# Patient Record
Sex: Female | Born: 1955 | Race: Black or African American | Hispanic: No | Marital: Married | Smoking: Never smoker
Health system: Southern US, Community
[De-identification: ages and names within clinical notes are randomized; demographics above are authoritative.]

## PROBLEM LIST (undated history)

## (undated) HISTORY — PX: TYMPANOPLASTY: SHX33

## (undated) HISTORY — PX: TUBAL LIGATION: SHX77

---

## 1998-04-03 ENCOUNTER — Encounter: Payer: Self-pay | Admitting: Obstetrics and Gynecology

## 1998-04-03 ENCOUNTER — Ambulatory Visit (HOSPITAL_COMMUNITY): Admission: RE | Admit: 1998-04-03 | Discharge: 1998-04-03 | Payer: Self-pay | Admitting: Obstetrics and Gynecology

## 1999-06-23 ENCOUNTER — Encounter: Payer: Self-pay | Admitting: Obstetrics and Gynecology

## 1999-06-23 ENCOUNTER — Ambulatory Visit (HOSPITAL_COMMUNITY): Admission: RE | Admit: 1999-06-23 | Discharge: 1999-06-23 | Payer: Self-pay | Admitting: Obstetrics and Gynecology

## 1999-06-30 ENCOUNTER — Other Ambulatory Visit: Admission: RE | Admit: 1999-06-30 | Discharge: 1999-06-30 | Payer: Self-pay | Admitting: Obstetrics and Gynecology

## 1999-10-07 ENCOUNTER — Ambulatory Visit (HOSPITAL_COMMUNITY): Admission: RE | Admit: 1999-10-07 | Discharge: 1999-10-07 | Payer: Self-pay | Admitting: Obstetrics and Gynecology

## 2001-04-12 ENCOUNTER — Other Ambulatory Visit: Admission: RE | Admit: 2001-04-12 | Discharge: 2001-04-12 | Payer: Self-pay | Admitting: Obstetrics and Gynecology

## 2002-04-26 ENCOUNTER — Other Ambulatory Visit: Admission: RE | Admit: 2002-04-26 | Discharge: 2002-04-26 | Payer: Self-pay | Admitting: Obstetrics and Gynecology

## 2002-08-21 ENCOUNTER — Emergency Department (HOSPITAL_COMMUNITY): Admission: EM | Admit: 2002-08-21 | Discharge: 2002-08-21 | Payer: Self-pay | Admitting: Emergency Medicine

## 2011-06-30 ENCOUNTER — Ambulatory Visit: Payer: 59

## 2011-06-30 ENCOUNTER — Ambulatory Visit (INDEPENDENT_AMBULATORY_CARE_PROVIDER_SITE_OTHER): Payer: 59 | Admitting: Internal Medicine

## 2011-06-30 VITALS — BP 117/71 | HR 60 | Temp 97.8°F | Resp 16 | Ht 66.75 in | Wt 161.6 lb

## 2011-06-30 DIAGNOSIS — Z0000 Encounter for general adult medical examination without abnormal findings: Secondary | ICD-10-CM

## 2011-06-30 DIAGNOSIS — Z228 Carrier of other infectious diseases: Secondary | ICD-10-CM

## 2011-06-30 DIAGNOSIS — Z9289 Personal history of other medical treatment: Secondary | ICD-10-CM | POA: Insufficient documentation

## 2011-06-30 NOTE — Progress Notes (Signed)
  Subjective:    Patient ID: Marissa Hawkins, female    DOB: 1956/01/15, 56 y.o.   MRN: [id number]  HPI  Very healthy, see scanned hx  Review of Systems    see scanned ROS Objective:   Physical Exam Normal  [facility] reading (PRIMARY) by  Dr. Perrin Maltese NAD on CXR       Assessment & Plan:  CXR to screen for TB normal No labs or tests wanted

## 2012-11-29 ENCOUNTER — Ambulatory Visit (INDEPENDENT_AMBULATORY_CARE_PROVIDER_SITE_OTHER): Payer: 59 | Admitting: Family Medicine

## 2012-11-29 VITALS — BP 118/74 | HR 66 | Temp 99.0°F | Resp 18 | Ht 66.0 in | Wt 167.0 lb

## 2012-11-29 DIAGNOSIS — Z0289 Encounter for other administrative examinations: Secondary | ICD-10-CM

## 2012-11-29 NOTE — Progress Notes (Signed)
°  Tuberculosis Risk Questionnaire  1. No Were you born outside the [place] in one of the following parts of the world: [place], [place], [place], [place] or [place]?    2. No Have you traveled outside the [place] and lived for more than one month in one of the following parts of the world: [place], [place], [place], [place] or [place]?    3. No Do you have a compromised immune system such as from any of the following conditions:HIV/AIDS, organ or bone marrow transplantation, diabetes, immunosuppressive medicines (e.g. Prednisone, Remicaide), leukemia, lymphoma, cancer of the head or neck, gastrectomy or jejunal bypass, end-stage renal disease (on dialysis), or silicosis?     4. Yes  Have you ever or do you plan on working in: a residential care center, a health care facility, a jail or prison or homeless shelter?center for handicap    5. No Have you ever: injected illegal drugs, used crack cocaine, lived in a homeless shelter  or been in jail or prison?     6. Yes  Have you ever been exposed to anyone with infectious tuberculosis? Has x ray hx     Tuberculosis Symptom Questionnaire  Do you currently have any of the following symptoms?  1. No Unexplained cough lasting more than 3 weeks?   2. No Unexplained fever lasting more than 3 weeks.   3. No Night Sweats (sweating that leaves the bedclothes and sheets wet)     4. No Shortness of Breath   5. No Chest Pain   6. No Unintentional weight loss    7. No Unexplained fatigue (very tired for no reason)

## 2013-04-02 NOTE — Progress Notes (Signed)
   Subjective:    Patient ID: Marissa Hawkins, female    DOB: June 20, 1955, 58 y.o.   MRN: [id number] Chief Complaint  Patient presents with  . Employment Physical    HPI  No complaints or concerns.  Here today for pre-emp PE w/ TB test.  History reviewed. No pertinent past medical history. History reviewed. No pertinent past surgical history. No current outpatient prescriptions on file prior to visit.   No current facility-administered medications on file prior to visit.   No Known Allergies Family History  Problem Relation Age of Onset  . Stroke Mother    History   Social History  . Marital Status: Married    Spouse Name: N/A    Number of Children: N/A  . Years of Education: N/A   Social History Main Topics  . Smoking status: Never Smoker   . Smokeless tobacco: None  . Alcohol Use: No  . Drug Use: No  . Sexual Activity: Yes   Other Topics Concern  . None   Social History Narrative  . None    Review of Systems  Constitutional: Negative for fever, diaphoresis and fatigue.  Respiratory: Negative for cough and shortness of breath.   Cardiovascular: Negative for chest pain.  Hematological: Negative for adenopathy.  All other systems reviewed and are negative.      BP 118/74  Pulse 66  Temp(Src) 99 F (37.2 C) (Oral)  Resp 18  Ht 5\' 6"  (1.676 m)  Wt 167 lb (75.751 kg)  BMI 26.97 kg/m2  SpO2 100% Objective:   Physical Exam  Constitutional: She is oriented to person, place, and time. She appears well-developed and well-nourished. No distress.  HENT:  Head: Normocephalic and atraumatic.  Right Ear: External ear normal.  Left Ear: External ear normal.  Eyes: Conjunctivae are normal. No scleral icterus.  Neck: Normal range of motion. Neck supple. No thyromegaly present.  Cardiovascular: Normal rate, regular rhythm, normal heart sounds and intact distal pulses.   Pulmonary/Chest: Effort normal and breath sounds normal. No respiratory distress.    Musculoskeletal: She exhibits no edema.  Lymphadenopathy:    She has no cervical adenopathy.  Neurological: She is alert and oriented to person, place, and time.  Skin: Skin is warm and dry. She is not diaphoretic. No erythema.  Psychiatric: She has a normal mood and affect. Her behavior is normal.      Assessment & Plan:  Other general medical examination for administrative purposes Form completed and scanned.  Delman Cheadle, MD MPH

## 2016-04-07 ENCOUNTER — Encounter (INDEPENDENT_AMBULATORY_CARE_PROVIDER_SITE_OTHER): Payer: Self-pay

## 2016-04-07 ENCOUNTER — Encounter: Payer: Self-pay | Admitting: Allergy and Immunology

## 2016-04-07 ENCOUNTER — Ambulatory Visit (INDEPENDENT_AMBULATORY_CARE_PROVIDER_SITE_OTHER): Payer: 59 | Admitting: Allergy and Immunology

## 2016-04-07 VITALS — BP 120/72 | HR 72 | Temp 98.1°F | Resp 16 | Ht 65.79 in | Wt 176.0 lb

## 2016-04-07 DIAGNOSIS — G43909 Migraine, unspecified, not intractable, without status migrainosus: Secondary | ICD-10-CM | POA: Diagnosis not present

## 2016-04-07 DIAGNOSIS — K219 Gastro-esophageal reflux disease without esophagitis: Secondary | ICD-10-CM

## 2016-04-07 DIAGNOSIS — J3089 Other allergic rhinitis: Secondary | ICD-10-CM | POA: Diagnosis not present

## 2016-04-07 MED ORDER — MONTELUKAST SODIUM 10 MG PO TABS: ORAL_TABLET | ORAL | 5 refills | Status: DC

## 2016-04-07 NOTE — Progress Notes (Signed)
NEW PATIENT NOTE  Referring Provider: No ref. provider found Primary Provider: Osborne Casco, MD Date of office visit: 04/07/2016    Subjective:   Chief Complaint:  Marissa Hawkins (DOB: 1956/02/27) is a 61 y.o. female who presents to the clinic on 04/07/2016 with a chief complaint of Nasal Congestion .  HPI: Marissa Hawkins presents to this clinic in evaluation of 3 major issues.  First, she has constant postnasal drip and throat clearing and intermittent raspy voice and coughing that's been present for about 5 years or so. She does not have any reflux symptoms. She does drink 2 cups of coffee per day but does not really consume any chocolate or alcohol.  Second, she has nasal itching and sneezing and nose blowing and stuffy nose without any anosmia or ugly nasal discharge or history of recurrent infections without any obvious provoking factor. She is tried [facility] on 2 occasions 30 days per occasion which has not really helped her very much. She's tried Claritin which has not helped. She's tried Mucinex which is given rise to GI upset.  Third she has intermittent headaches about 1 time per month affecting the left side of her face that migrates to the back of her neck that can put her down in bed for relief. They usually last 2-3 days. They're pounding in quality and are not associated with any scotoma or other neurological symptoms but occasionally are associated with nausea.  Provoking factors for some of her symptoms include exposure to various fumes and perfumes and sense and cleaning agents.  History reviewed. No pertinent past medical history.  Past Surgical History:  Procedure Laterality Date  . TUBAL LIGATION     Age 36  . TYMPANOPLASTY     Age 33    Allergies as of 04/07/2016   No Known Allergies     Medication List      GARLIC PO Take by mouth.   GLUCOSAMINE-CHONDROITIN PO Take by mouth.   VITAMIN C PO Take by mouth.   VITAMIN E PO Take by  mouth.       Review of systems negative except as noted in HPI / PMHx or noted below:  Review of Systems  Constitutional: Negative.   HENT: Negative.   Eyes: Negative.   Respiratory: Negative.   Cardiovascular: Negative.   Gastrointestinal: Negative.   Genitourinary: Negative.   Musculoskeletal: Negative.   Skin: Negative.   Neurological: Negative.   Endo/Heme/Allergies: Negative.   Psychiatric/Behavioral: Negative.     Family History  Problem Relation Age of Onset  . Stroke Mother   . Breast cancer Mother   . Cancer Father     Social History   Social History  . Marital status: Married    Spouse name: N/A  . Number of children: N/A  . Years of education: N/A   Occupational History  . Not on file.   Social History Main Topics  . Smoking status: Never Smoker  . Smokeless tobacco: Never Used  . Alcohol use No  . Drug use: No  . Sexual activity: Yes   Other Topics Concern  . Not on file   Social History Narrative  . No narrative on file    Environmental and Social history  Lives in a house with a dry environment, no animals located inside the household, no carpeting in the bedroom, no plastic on the bed or pillow, no smokers located inside the household. She is a Optometrist.  Objective:   Vitals:  04/07/16 1500  BP: 120/72  Pulse: 72  Resp: 16  Temp: 98.1 F (36.7 C)   Height: 5' 5.79" (167.1 cm) Weight: 176 lb (79.8 kg)  Physical Exam  Constitutional: She is well-developed, well-nourished, and in no distress.  HENT:  Head: Normocephalic.  Right Ear: Tympanic membrane, external ear and ear canal normal.  Left Ear: Tympanic membrane, external ear and ear canal normal.  Nose: Nose normal. No mucosal edema or rhinorrhea.  Mouth/Throat: Uvula is midline, oropharynx is clear and moist and mucous membranes are normal. No oropharyngeal exudate.  Eyes: Conjunctivae are normal.  Neck: Trachea normal. No tracheal tenderness present. No  tracheal deviation present. No thyromegaly present.  Cardiovascular: Normal rate, regular rhythm, S1 normal, S2 normal and normal heart sounds.   No murmur heard. Pulmonary/Chest: Breath sounds normal. No stridor. No respiratory distress. She has no wheezes. She has no rales.  Musculoskeletal: She exhibits no edema.  Lymphadenopathy:       Head (right side): No tonsillar adenopathy present.       Head (left side): No tonsillar adenopathy present.    She has no cervical adenopathy.  Neurological: She is alert. Gait normal.  Skin: No rash noted. She is not diaphoretic. No erythema. Nails show no clubbing.  Psychiatric: Mood and affect normal.    Diagnostics: Allergy skin tests were performed. She did not demonstrate any hypersensitivity against a screening panel of aeroallergens or foods.  Assessment and Plan:    1. Other allergic rhinitis   2. LPRD (laryngopharyngeal reflux disease)   3. Migraine syndrome     1. Allergen avoidance measures  2. Treat and prevent inflammation:   A. montelukast 10 mg tablet 1 time per day  B. OTC Rhinocort one spray each nostril one time per day  3. Treat and prevent reflux:   A. consolidate all caffeine consumption  B. OTC ranitidine 150 mg 2 tablets once a day  4. If needed:   A. nasal saline spray  B. OTC antihistamine - Claritin/Zyrtec/Allegra  5. Return to clinic in 4 weeks or earlier if problem  I will have Marissa Hawkins utilize therapy directed against both respiratory tract inflammation and reflux-induced respiratory disease and assess her response to this treatment over the course of the next 4 weeks. As well, I think that her headaches will probably decrease dramatically when she gets off her caffeine consumption. Further evaluation and treatment will be based upon her response.  Allena Katz, MD Allergy / Immunology [place]

## 2016-04-07 NOTE — Patient Instructions (Addendum)
  1. Allergen avoidance measures  2. Treat and prevent inflammation:   A. montelukast 10 mg tablet 1 time per day  B. OTC Rhinocort one spray each nostril one time per day  3. Treat and prevent reflux:   A. consolidate all caffeine consumption  B. OTC ranitidine 150 mg 2 tablets once a day  4. If needed:   A. nasal saline spray  B. OTC antihistamine - Claritin/Zyrtec/Allegra  5. Return to clinic in 4 weeks or earlier if problem

## 2016-04-20 DIAGNOSIS — D126 Benign neoplasm of colon, unspecified: Secondary | ICD-10-CM | POA: Diagnosis not present

## 2016-04-20 DIAGNOSIS — Z1211 Encounter for screening for malignant neoplasm of colon: Secondary | ICD-10-CM | POA: Diagnosis not present

## 2016-04-27 DIAGNOSIS — H04123 Dry eye syndrome of bilateral lacrimal glands: Secondary | ICD-10-CM | POA: Diagnosis not present

## 2016-04-27 DIAGNOSIS — H40033 Anatomical narrow angle, bilateral: Secondary | ICD-10-CM | POA: Diagnosis not present

## 2016-05-05 ENCOUNTER — Ambulatory Visit: Payer: 59 | Admitting: Allergy and Immunology

## 2016-06-30 DIAGNOSIS — M79644 Pain in right finger(s): Secondary | ICD-10-CM | POA: Diagnosis not present

## 2016-12-16 DIAGNOSIS — H6123 Impacted cerumen, bilateral: Secondary | ICD-10-CM | POA: Diagnosis not present

## 2016-12-16 DIAGNOSIS — Z8669 Personal history of other diseases of the nervous system and sense organs: Secondary | ICD-10-CM | POA: Diagnosis not present

## 2016-12-16 DIAGNOSIS — H9313 Tinnitus, bilateral: Secondary | ICD-10-CM | POA: Diagnosis not present

## 2016-12-30 DIAGNOSIS — Z1231 Encounter for screening mammogram for malignant neoplasm of breast: Secondary | ICD-10-CM | POA: Diagnosis not present

## 2017-01-15 DIAGNOSIS — J309 Allergic rhinitis, unspecified: Secondary | ICD-10-CM | POA: Diagnosis not present

## 2017-01-15 DIAGNOSIS — Z0000 Encounter for general adult medical examination without abnormal findings: Secondary | ICD-10-CM | POA: Diagnosis not present

## 2017-01-15 DIAGNOSIS — E7800 Pure hypercholesterolemia, unspecified: Secondary | ICD-10-CM | POA: Diagnosis not present

## 2017-01-15 DIAGNOSIS — Z131 Encounter for screening for diabetes mellitus: Secondary | ICD-10-CM | POA: Diagnosis not present

## 2017-03-30 DIAGNOSIS — Z780 Asymptomatic menopausal state: Secondary | ICD-10-CM | POA: Diagnosis not present

## 2018-01-06 DIAGNOSIS — Z803 Family history of malignant neoplasm of breast: Secondary | ICD-10-CM | POA: Diagnosis not present

## 2018-01-06 DIAGNOSIS — Z1231 Encounter for screening mammogram for malignant neoplasm of breast: Secondary | ICD-10-CM | POA: Diagnosis not present

## 2018-03-07 DIAGNOSIS — E7800 Pure hypercholesterolemia, unspecified: Secondary | ICD-10-CM | POA: Diagnosis not present

## 2018-03-07 DIAGNOSIS — Z1159 Encounter for screening for other viral diseases: Secondary | ICD-10-CM | POA: Diagnosis not present

## 2018-03-07 DIAGNOSIS — Z0000 Encounter for general adult medical examination without abnormal findings: Secondary | ICD-10-CM | POA: Diagnosis not present

## 2018-03-07 DIAGNOSIS — Z131 Encounter for screening for diabetes mellitus: Secondary | ICD-10-CM | POA: Diagnosis not present

## 2018-04-05 DIAGNOSIS — H04123 Dry eye syndrome of bilateral lacrimal glands: Secondary | ICD-10-CM | POA: Diagnosis not present

## 2018-04-25 DIAGNOSIS — H04123 Dry eye syndrome of bilateral lacrimal glands: Secondary | ICD-10-CM | POA: Diagnosis not present

## 2018-04-25 DIAGNOSIS — H40033 Anatomical narrow angle, bilateral: Secondary | ICD-10-CM | POA: Diagnosis not present

## 2020-10-17 DIAGNOSIS — E663 Overweight: Secondary | ICD-10-CM | POA: Diagnosis not present

## 2020-10-17 DIAGNOSIS — Z833 Family history of diabetes mellitus: Secondary | ICD-10-CM | POA: Diagnosis not present

## 2020-10-17 DIAGNOSIS — Z6828 Body mass index (BMI) 28.0-28.9, adult: Secondary | ICD-10-CM | POA: Diagnosis not present

## 2020-10-17 DIAGNOSIS — Z7722 Contact with and (suspected) exposure to environmental tobacco smoke (acute) (chronic): Secondary | ICD-10-CM | POA: Diagnosis not present

## 2020-10-17 DIAGNOSIS — Z809 Family history of malignant neoplasm, unspecified: Secondary | ICD-10-CM | POA: Diagnosis not present

## 2020-11-27 DIAGNOSIS — H259 Unspecified age-related cataract: Secondary | ICD-10-CM | POA: Diagnosis not present

## 2020-11-27 DIAGNOSIS — H524 Presbyopia: Secondary | ICD-10-CM | POA: Diagnosis not present

## 2020-11-27 DIAGNOSIS — H52223 Regular astigmatism, bilateral: Secondary | ICD-10-CM | POA: Diagnosis not present

## 2020-11-27 DIAGNOSIS — Z135 Encounter for screening for eye and ear disorders: Secondary | ICD-10-CM | POA: Diagnosis not present

## 2020-11-27 DIAGNOSIS — H5213 Myopia, bilateral: Secondary | ICD-10-CM | POA: Diagnosis not present

## 2020-12-16 DIAGNOSIS — H25811 Combined forms of age-related cataract, right eye: Secondary | ICD-10-CM | POA: Diagnosis not present

## 2020-12-16 DIAGNOSIS — Z01818 Encounter for other preprocedural examination: Secondary | ICD-10-CM | POA: Diagnosis not present

## 2020-12-16 DIAGNOSIS — H25812 Combined forms of age-related cataract, left eye: Secondary | ICD-10-CM | POA: Diagnosis not present

## 2021-01-02 DIAGNOSIS — H25811 Combined forms of age-related cataract, right eye: Secondary | ICD-10-CM | POA: Diagnosis not present

## 2021-01-28 DIAGNOSIS — Z1231 Encounter for screening mammogram for malignant neoplasm of breast: Secondary | ICD-10-CM | POA: Diagnosis not present

## 2021-01-30 DIAGNOSIS — H25812 Combined forms of age-related cataract, left eye: Secondary | ICD-10-CM | POA: Diagnosis not present

## 2021-06-18 DIAGNOSIS — M25519 Pain in unspecified shoulder: Secondary | ICD-10-CM | POA: Diagnosis not present

## 2021-06-25 DIAGNOSIS — M25519 Pain in unspecified shoulder: Secondary | ICD-10-CM | POA: Diagnosis not present

## 2021-06-28 ENCOUNTER — Ambulatory Visit (INDEPENDENT_AMBULATORY_CARE_PROVIDER_SITE_OTHER): Payer: Medicare HMO

## 2021-06-28 ENCOUNTER — Ambulatory Visit
Admission: RE | Admit: 2021-06-28 | Discharge: 2021-06-28 | Disposition: A | Discharge status: Home / Self Care | Payer: Non-veteran care | Source: Ambulatory Visit | Attending: Family Medicine | Admitting: Family Medicine

## 2021-06-28 VITALS — BP 130/75 | HR 77 | Temp 98.9°F | Resp 16

## 2021-06-28 DIAGNOSIS — M25572 Pain in left ankle and joints of left foot: Secondary | ICD-10-CM | POA: Diagnosis not present

## 2021-06-28 NOTE — Discharge Instructions (Addendum)
Try using ice therapy and elevating your foot.  Use ibuprofen per package directions at least 3 times a day for the next couple of days to see if that helps relieve the pain and inflammation.  If your symptoms do not improve in a few days follow-up with your primary care provider or you can contact the [facility] office for further care ?

## 2021-06-28 NOTE — ED Provider Notes (Signed)
?[facility] ? ? ? ?CSN: [id number] ?Arrival date & time: 06/28/21  1144 ? ? ?  ? ?History   ?Chief Complaint ?Chief Complaint  ?Patient presents with  ? Ankle Pain  ?  Entered by patient  ? ? ?HPI ?Marissa Hawkins is a 66 y.o. female.  ? ?HPI ?Patient presents today with acute onset left ankle pain.  ?She denies any known injury. She reports pain developed upon awakening this morning. No prior issues with her ankle or foot. She is not a diabetic however, endorses occasional paraesthesia dorsum/lateral aspect of her left foot and ankle. She has no know history of bone spurs or plantar/calcaneal fasciitis. She has not attempted relief as symptoms only developed upon awakening this morning.  ? ?History reviewed. No pertinent past medical history. ? ?Patient Active Problem List  ? Diagnosis Date Noted  ? 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 ? ?Past Surgical History:  ?Procedure Laterality Date  ? TUBAL LIGATION    ? Age 86  ? TYMPANOPLASTY    ? Age 2  ? ? ?OB History   ?No obstetric history on file. ?  ? ? ? ?Home Medications   ? ?Prior to Admission medications   ?Medication Sig Start Date End Date Taking? Authorizing Provider  ?Ascorbic Acid (VITAMIN C PO) Take by mouth.    [provider]  ?GARLIC PO Take by mouth.    [provider]  ?GLUCOSAMINE-CHONDROITIN PO Take by mouth.    [provider]  ?montelukast (SINGULAIR) 10 MG tablet Take one tablet by mouth once daily as directed. 04/07/16   Kozlow, Donnamarie Poag, MD  ?VITAMIN E PO Take by mouth.    [provider]  ? ? ?Family History ?Family History  ?Problem Relation Age of Onset  ? Stroke Mother   ? Breast cancer Mother   ? Cancer Father   ? ? ?Social History ?Social History  ? ?Tobacco Use  ? Smoking status: Never  ? Smokeless tobacco: Never  ?Vaping Use  ? Vaping Use: Never used  ?Substance Use Topics  ? Alcohol use: No  ? Drug use: No  ? ? ? ?Allergies   ?Patient has no known allergies. ? ? ?Review of Systems ?Review of  Systems ?Pertinent negatives listed in HPI  ? ?Physical Exam ?Triage Vital Signs ?ED Triage Vitals  ?Enc Vitals Group  ?   BP 06/28/21 1207 130/75  ?   Pulse Rate 06/28/21 1207 77  ?   Resp 06/28/21 1207 16  ?   Temp 06/28/21 1207 98.9 ?F (37.2 ?C)  ?   Temp Source 06/28/21 1207 Oral  ?   SpO2 06/28/21 1207 98 %  ?   Weight --   ?   Height --   ?   Head Circumference --   ?   Peak Flow --   ?   Pain Score 06/28/21 1212 7  ?   Pain Loc --   ?   Pain Edu? --   ?   Excl. in [place]? --   ? ?No data found. ? ?Updated Vital Signs ?BP 130/75 (BP Location: Left Arm)   Pulse 77   Temp 98.9 ?F (37.2 ?C) (Oral)   Resp 16   SpO2 98%  ? ?Visual Acuity ?Right Eye Distance:   ?Left Eye Distance:   ?Bilateral Distance:   ? ?Right Eye Near:   ?Left Eye Near:    ?Bilateral Near:    ? ?Physical Exam ?Constitutional:   ?  Appearance: Normal appearance.  ?HENT:  ?   Head: Normocephalic and atraumatic.  ?Cardiovascular:  ?   Rate and Rhythm: Normal rate and regular rhythm.  ?Pulmonary:  ?   Effort: Pulmonary effort is normal.  ?   Breath sounds: Normal breath sounds.  ?Musculoskeletal:  ?   Cervical back: Normal range of motion and neck supple.  ?   Left ankle: Tenderness present over the lateral malleolus.  ?   Left foot: Tenderness present.  ?     Legs: ? ?Skin: ?   General: Skin is warm.  ?   Capillary Refill: Capillary refill takes less than 2 seconds.  ?Neurological:  ?   Mental Status: She is alert.  ?Psychiatric:     ?   Attention and Perception: Attention normal.     ?   Mood and Affect: Mood normal.  ? ? ? ?UC Treatments / Results  ?Labs ?(all labs ordered are listed, but only abnormal results are displayed) ?Labs Reviewed - No data to display ? ?EKG ? ? ?Radiology ?DG Ankle Complete Left ? ?Result Date: 06/28/2021 ?CLINICAL DATA:  Lateral ankle pain.  No known injury. EXAM: LEFT ANKLE COMPLETE - 3+ VIEW COMPARISON:  None. FINDINGS: There is no evidence of fracture, dislocation, or joint effusion. There is no evidence of  arthropathy or other focal bone abnormality. Soft tissues are unremarkable. IMPRESSION: Negative. Electronically Signed   By: Davina Poke D.O.   On: 06/28/2021 13:25   ? ?Procedures ?Procedures (including critical care time) ? ?Medications Ordered in UC ?Medications - No data to display ? ?Initial Impression / Assessment and Plan / UC Course  ?I have reviewed the triage vital signs and the nursing notes. ? ?Pertinent labs & imaging results that were available during my care of the patient were reviewed by me and considered in my medical decision making (see chart for details). ? ? Care transferred to Willeen Cass, NP. See note for Plan and treatment. ?Final Clinical Impressions(s) / UC Diagnoses  ? ?Final diagnoses:  ?Pain of joint of left ankle and foot  ? ? ? ?Discharge Instructions   ? ?  ?Try using ice therapy and elevating your foot.  Use ibuprofen per package directions at least 3 times a day for the next couple of days to see if that helps relieve the pain and inflammation.  If your symptoms do not improve in a few days follow-up with your primary care provider or you can contact the [facility] office for further care ? ? ? ? ?ED Prescriptions   ?None ?  ? ?PDMP not reviewed this encounter. ?  ?Scot Jun, FNP ?06/29/21 2344 ? ?

## 2021-06-28 NOTE — ED Triage Notes (Signed)
Pt presents with left ankle pain started this morning. ?

## 2021-06-28 NOTE — ED Provider Notes (Signed)
?[facility] ? ? ? ?CSN: [id number] ?Arrival date & time: 06/28/21  1144 ? ? ?  ? ?History   ?Chief Complaint ?Chief Complaint  ?Patient presents with  ? Ankle Pain  ?  Entered by patient  ? ? ?HPI ?Marissa Hawkins is a 66 y.o. female.  Patient seen by Lavell Anchors, NP; please see her note for complete history and physical. ? ?Patient reports she did nothing out of the ordinary yesterday and felt fine last night, but when she woke up this morning and took her first steps out of bed, her left lateral foot was painful and she feels the pain from her foot all the way up to her ankle.  Denies injury such as rolling her ankle.  Denies change in usual activity level.  Denies numbness or tingling. ? ? ?Ankle Pain ? ?History reviewed. No pertinent past medical history. ? ?Patient Active Problem List  ? Diagnosis Date Noted  ? 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 ? ?Past Surgical History:  ?Procedure Laterality Date  ? TUBAL LIGATION    ? Age 75  ? TYMPANOPLASTY    ? Age 58  ? ? ?OB History   ?No obstetric history on file. ?  ? ? ? ?Home Medications   ? ?Prior to Admission medications   ?Medication Sig Start Date End Date Taking? Authorizing Provider  ?Ascorbic Acid (VITAMIN C PO) Take by mouth.    [provider]  ?GARLIC PO Take by mouth.    [provider]  ?GLUCOSAMINE-CHONDROITIN PO Take by mouth.    [provider]  ?montelukast (SINGULAIR) 10 MG tablet Take one tablet by mouth once daily as directed. 04/07/16   Kozlow, Donnamarie Poag, MD  ?VITAMIN E PO Take by mouth.    [provider]  ? ? ?Family History ?Family History  ?Problem Relation Age of Onset  ? Stroke Mother   ? Breast cancer Mother   ? Cancer Father   ? ? ?Social History ?Social History  ? ?Tobacco Use  ? Smoking status: Never  ? Smokeless tobacco: Never  ?Vaping Use  ? Vaping Use: Never used  ?Substance Use Topics  ? Alcohol use: No  ? Drug use: No  ? ? ? ?Allergies   ?Patient has no known allergies. ? ? ?Review of  Systems ?Review of Systems  ?Musculoskeletal:  Negative for joint swelling.  ?Skin:  Negative for color change.  ? ? ?Physical Exam ?Triage Vital Signs ?ED Triage Vitals  ?Enc Vitals Group  ?   BP 06/28/21 1207 130/75  ?   Pulse Rate 06/28/21 1207 77  ?   Resp 06/28/21 1207 16  ?   Temp 06/28/21 1207 98.9 ?F (37.2 ?C)  ?   Temp Source 06/28/21 1207 Oral  ?   SpO2 06/28/21 1207 98 %  ?   Weight --   ?   Height --   ?   Head Circumference --   ?   Peak Flow --   ?   Pain Score 06/28/21 1212 7  ?   Pain Loc --   ?   Pain Edu? --   ?   Excl. in [place]? --   ? ?No data found. ? ?Updated Vital Signs ?BP 130/75 (BP Location: Left Arm)   Pulse 77   Temp 98.9 ?F (37.2 ?C) (Oral)   Resp 16   SpO2 98%  ? ?Visual Acuity ?Right Eye Distance:   ?Left Eye Distance:   ?  Bilateral Distance:   ? ?Right Eye Near:   ?Left Eye Near:    ?Bilateral Near:    ? ?Physical Exam ?Constitutional:   ?   General: She is not in acute distress. ?   Appearance: Normal appearance. She is not ill-appearing.  ?Musculoskeletal:  ?   Left ankle: No swelling or deformity. Tenderness present.  ?   Left foot: Normal range of motion and normal capillary refill. Tenderness present. No swelling, deformity or bony tenderness.  ?   Comments: Limping gait, favoring left lower extremity.  ?Skin: ?   Findings: No bruising or erythema.  ?Neurological:  ?   Mental Status: She is alert.  ? ? ? ?UC Treatments / Results  ?Labs ?(all labs ordered are listed, but only abnormal results are displayed) ?Labs Reviewed - No data to display ? ?EKG ? ? ?Radiology ?DG Ankle Complete Left ? ?Result Date: 06/28/2021 ?CLINICAL DATA:  Lateral ankle pain.  No known injury. EXAM: LEFT ANKLE COMPLETE - 3+ VIEW COMPARISON:  None. FINDINGS: There is no evidence of fracture, dislocation, or joint effusion. There is no evidence of arthropathy or other focal bone abnormality. Soft tissues are unremarkable. IMPRESSION: Negative. Electronically Signed   By: Davina Poke D.O.   On: 06/28/2021  13:25   ? ?Procedures ?Procedures (including critical care time) ? ?Medications Ordered in UC ?Medications - No data to display ? ?Initial Impression / Assessment and Plan / UC Course  ?I have reviewed the triage vital signs and the nursing notes. ? ?Pertinent labs & imaging results that were available during my care of the patient were reviewed by me and considered in my medical decision making (see chart for details). ? ?  ?Not sure of cause of patient's pain as she is not injured it and x-ray appears unremarkable.  Discussed treatment options with patient.  Patient elects to take her own ibuprofen at home on a schedule for the next few days and use ice therapy to help manage her pain.  She will follow-up with her PCP or [facility] if her symptoms do not improve. ? ?Final Clinical Impressions(s) / UC Diagnoses  ? ?Final diagnoses:  ?Pain of joint of left ankle and foot  ? ? ? ?Discharge Instructions   ? ?  ?Try using ice therapy and elevating your foot.  Use ibuprofen per package directions at least 3 times a day for the next couple of days to see if that helps relieve the pain and inflammation.  If your symptoms do not improve in a few days follow-up with your primary care provider or you can contact the [facility] office for further care ? ? ?ED Prescriptions   ?None ?  ? ?PDMP not reviewed this encounter. ?  ?Carvel Getting, NP ?06/28/21 1405 ? ?

## 2021-07-02 DIAGNOSIS — M25519 Pain in unspecified shoulder: Secondary | ICD-10-CM | POA: Diagnosis not present

## 2021-07-31 DIAGNOSIS — M25519 Pain in unspecified shoulder: Secondary | ICD-10-CM | POA: Diagnosis not present

## 2021-08-20 DIAGNOSIS — H9041 Sensorineural hearing loss, unilateral, right ear, with unrestricted hearing on the contralateral side: Secondary | ICD-10-CM | POA: Diagnosis not present

## 2021-08-20 DIAGNOSIS — H6123 Impacted cerumen, bilateral: Secondary | ICD-10-CM | POA: Diagnosis not present

## 2021-10-16 ENCOUNTER — Ambulatory Visit (INDEPENDENT_AMBULATORY_CARE_PROVIDER_SITE_OTHER): Payer: Medicare HMO | Admitting: Internal Medicine

## 2021-10-16 ENCOUNTER — Encounter: Payer: Self-pay | Admitting: Internal Medicine

## 2021-10-16 VITALS — BP 132/72 | HR 86 | Resp 16 | Ht 66.0 in | Wt 176.0 lb

## 2021-10-16 DIAGNOSIS — Z0000 Encounter for general adult medical examination without abnormal findings: Secondary | ICD-10-CM | POA: Diagnosis not present

## 2021-10-16 DIAGNOSIS — Z1159 Encounter for screening for other viral diseases: Secondary | ICD-10-CM

## 2021-10-16 DIAGNOSIS — Z13820 Encounter for screening for osteoporosis: Secondary | ICD-10-CM

## 2021-10-16 DIAGNOSIS — E559 Vitamin D deficiency, unspecified: Secondary | ICD-10-CM | POA: Diagnosis not present

## 2021-10-16 DIAGNOSIS — E2839 Other primary ovarian failure: Secondary | ICD-10-CM | POA: Diagnosis not present

## 2021-10-16 DIAGNOSIS — Z13220 Encounter for screening for lipoid disorders: Secondary | ICD-10-CM | POA: Diagnosis not present

## 2021-10-16 NOTE — Patient Instructions (Addendum)
It was great seeing you today!  Plan discussed at today's visit: -Blood work ordered today, results will be uploaded to [facility].  -Mammogram and bone scan ordered  -Call previous doctor's office to get immunization record  Follow up in: 1 year   Take care and let us know if you have any questions or concerns prior to your next visit.  Dr. Rosana Berger

## 2021-10-16 NOTE — Progress Notes (Signed)
New Patient Office Visit  Subjective    Patient ID: AARIEL EMS, female    DOB: [birth year]  Age: 66 y.o. MRN: [id number]  CC:  Chief Complaint  Patient presents with   Establish Care    HPI Marissa Hawkins presents to establish care. Has no chronic medical conditions and does not take any daily prescription medications other than vitamins.   Seasonal Allergies: -Had been on Singulair 10 mg but symptoms are currently controlled   Health Maintenance: -Mammogram through [facility] scheduled in November, DEXA due -Colonoscopy 2018, repeat in 10 year, obtaining records -Immunizations: generally the patient does not get vaccines but does believe she is up to date with Tdap, will call her previous PCP office to obtain records   Outpatient Encounter Medications as of 10/16/2021  Medication Sig   Ascorbic Acid (VITAMIN C PO) Take by mouth.   VITAMIN D PO Take by mouth.   VITAMIN E PO Take by mouth.   Zinc Sulfate (ZINC 15 PO) Take by mouth.   [DISCONTINUED] GARLIC PO Take by mouth.   [DISCONTINUED] GLUCOSAMINE-CHONDROITIN PO Take by mouth.   montelukast (SINGULAIR) 10 MG tablet Take one tablet by mouth once daily as directed. (Patient not taking: Reported on 10/16/2021)   No facility-administered encounter medications on file as of 10/16/2021.    No past medical history on file.  Past Surgical History:  Procedure Laterality Date   TUBAL LIGATION     Age 55   TYMPANOPLASTY     Age 39    Family History  Problem Relation Age of Onset   Stroke Mother    Breast cancer Mother    Cancer Father     Social History   Socioeconomic History   Marital status: Married    Spouse name: Not on file   Number of children: Not on file   Years of education: Not on file   Highest education level: Not on file  Occupational History   Not on file  Tobacco Use   Smoking status: Never   Smokeless tobacco: Never  Vaping Use   Vaping Use: Never used  Substance and Sexual Activity    Alcohol use: No   Drug use: No   Sexual activity: Yes  Other Topics Concern   Not on file  Social History Narrative   Not on file   Social Determinants of Health   Financial Resource Strain: Not on file  Food Insecurity: Not on file  Transportation Needs: Not on file  Physical Activity: Not on file  Stress: Not on file  Social Connections: Not on file  Intimate Partner Violence: Not on file    Review of Systems  All other systems reviewed and are negative.       Objective    BP 132/72   Pulse 86   Resp 16   Ht '5\' 6"'$  (1.676 m)   Wt 176 lb (79.8 kg)   SpO2 98%   BMI 28.41 kg/m   Physical Exam Constitutional:      Appearance: Normal appearance.  HENT:     Head: Normocephalic and atraumatic.     Mouth/Throat:     Mouth: Mucous membranes are moist.     Comments: Mild PND present Eyes:     Conjunctiva/sclera: Conjunctivae normal.  Cardiovascular:     Rate and Rhythm: Normal rate and regular rhythm.  Pulmonary:     Effort: Pulmonary effort is normal.     Breath sounds: Normal breath sounds.  Musculoskeletal:  Right lower leg: No edema.     Left lower leg: No edema.  Lymphadenopathy:     Cervical: No cervical adenopathy.  Skin:    General: Skin is warm and dry.  Neurological:     General: No focal deficit present.     Mental Status: She is alert. Mental status is at baseline.  Psychiatric:        Mood and Affect: Mood normal.        Behavior: Behavior normal.         Assessment & Plan:   1. Encounter for medical examination to establish care/Lipid screening/Need for hepatitis C screening test: Screening labs due.   - CBC w/Diff/Platelet - COMPLETE METABOLIC PANEL WITH GFR - Lipid Profile - Hepatitis C Antibody  2. Screening for osteoporosis/Estrogen deficiency: DEXA scan due, will order to [facility] so she can schedule it with her mammogram.  - DG Bone Density; Future  3. Vitamin D deficiency: Currently on OTC supplements, will recheck  level.  - Vitamin D (25 hydroxy)  Return in about 1 year (around 10/17/2022).   Teodora Medici, DO

## 2021-10-17 LAB — CBC WITH DIFFERENTIAL/PLATELET
Absolute Monocytes: 374 cells/uL (ref 200–950)
Basophils Absolute: 30 cells/uL (ref 0–200)
Basophils Relative: 0.7 %
Eosinophils Absolute: 112 cells/uL (ref 15–500)
Eosinophils Relative: 2.6 %
HCT: 39.1 % (ref 35.0–45.0)
Hemoglobin: 12.6 g/dL (ref 11.7–15.5)
Lymphs Abs: 2172 cells/uL (ref 850–3900)
MCH: 30.4 pg (ref 27.0–33.0)
MCHC: 32.2 g/dL (ref 32.0–36.0)
MCV: 94.4 fL (ref 80.0–100.0)
MPV: 10.4 fL (ref 7.5–12.5)
Monocytes Relative: 8.7 %
Neutro Abs: 1613 cells/uL (ref 1500–7800)
Neutrophils Relative %: 37.5 %
Platelets: 256 10*3/uL (ref 140–400)
RBC: 4.14 10*6/uL (ref 3.80–5.10)
RDW: 11.9 % (ref 11.0–15.0)
Total Lymphocyte: 50.5 %
WBC: 4.3 10*3/uL (ref 3.8–10.8)

## 2021-10-17 LAB — COMPLETE METABOLIC PANEL WITH GFR
AG Ratio: 1.5 (calc) (ref 1.0–2.5)
ALT: 16 U/L (ref 6–29)
AST: 20 U/L (ref 10–35)
Albumin: 4.6 g/dL (ref 3.6–5.1)
Alkaline phosphatase (APISO): 84 U/L (ref 37–153)
BUN: 12 mg/dL (ref 7–25)
CO2: 28 mmol/L (ref 20–32)
Calcium: 10 mg/dL (ref 8.6–10.4)
Chloride: 104 mmol/L (ref 98–110)
Creat: 0.88 mg/dL (ref 0.50–1.05)
Globulin: 3 g/dL (calc) (ref 1.9–3.7)
Glucose, Bld: 94 mg/dL (ref 65–99)
Potassium: 4.5 mmol/L (ref 3.5–5.3)
Sodium: 141 mmol/L (ref 135–146)
Total Bilirubin: 0.4 mg/dL (ref 0.2–1.2)
Total Protein: 7.6 g/dL (ref 6.1–8.1)
eGFR: 72 mL/min/{1.73_m2} (ref >=60)

## 2021-10-17 LAB — HEPATITIS C ANTIBODY: Hepatitis C Ab: NONREACTIVE

## 2021-10-17 LAB — LIPID PANEL
Cholesterol: 288 mg/dL — ABNORMAL HIGH (ref <200)
HDL: 84 mg/dL (ref >=50)
LDL Cholesterol (Calc): 185 mg/dL (calc) — ABNORMAL HIGH
Non-HDL Cholesterol (Calc): 204 mg/dL (calc) — ABNORMAL HIGH (ref <130)
Total CHOL/HDL Ratio: 3.4 (calc) (ref <5.0)
Triglycerides: 84 mg/dL (ref <150)

## 2021-10-17 LAB — VITAMIN D 25 HYDROXY (VIT D DEFICIENCY, FRACTURES): Vit D, 25-Hydroxy: 45 ng/mL (ref 30–100)

## 2022-01-13 DIAGNOSIS — H524 Presbyopia: Secondary | ICD-10-CM | POA: Diagnosis not present

## 2022-01-13 DIAGNOSIS — Z961 Presence of intraocular lens: Secondary | ICD-10-CM | POA: Diagnosis not present

## 2022-02-03 DIAGNOSIS — Z1231 Encounter for screening mammogram for malignant neoplasm of breast: Secondary | ICD-10-CM | POA: Diagnosis not present

## 2022-02-13 DIAGNOSIS — Z780 Asymptomatic menopausal state: Secondary | ICD-10-CM | POA: Diagnosis not present

## 2022-03-18 DIAGNOSIS — Z01419 Encounter for gynecological examination (general) (routine) without abnormal findings: Secondary | ICD-10-CM | POA: Diagnosis not present

## 2022-10-19 ENCOUNTER — Ambulatory Visit: Admitting: Internal Medicine

## 2022-11-03 DIAGNOSIS — Z1331 Encounter for screening for depression: Secondary | ICD-10-CM | POA: Diagnosis not present

## 2022-11-03 DIAGNOSIS — R7303 Prediabetes: Secondary | ICD-10-CM | POA: Diagnosis not present

## 2022-11-03 DIAGNOSIS — Z0000 Encounter for general adult medical examination without abnormal findings: Secondary | ICD-10-CM | POA: Diagnosis not present

## 2022-11-03 DIAGNOSIS — Z136 Encounter for screening for cardiovascular disorders: Secondary | ICD-10-CM | POA: Diagnosis not present

## 2022-11-03 DIAGNOSIS — Z1389 Encounter for screening for other disorder: Secondary | ICD-10-CM | POA: Diagnosis not present

## 2022-11-03 DIAGNOSIS — Z7189 Other specified counseling: Secondary | ICD-10-CM | POA: Diagnosis not present

## 2022-11-03 DIAGNOSIS — Z131 Encounter for screening for diabetes mellitus: Secondary | ICD-10-CM | POA: Diagnosis not present

## 2022-11-03 DIAGNOSIS — E7800 Pure hypercholesterolemia, unspecified: Secondary | ICD-10-CM | POA: Diagnosis not present

## 2023-02-05 DIAGNOSIS — H04123 Dry eye syndrome of bilateral lacrimal glands: Secondary | ICD-10-CM | POA: Diagnosis not present

## 2023-02-05 DIAGNOSIS — H524 Presbyopia: Secondary | ICD-10-CM | POA: Diagnosis not present

## 2023-02-05 DIAGNOSIS — Z1231 Encounter for screening mammogram for malignant neoplasm of breast: Secondary | ICD-10-CM | POA: Diagnosis not present

## 2023-02-05 DIAGNOSIS — Z961 Presence of intraocular lens: Secondary | ICD-10-CM | POA: Diagnosis not present

## 2023-03-11 DIAGNOSIS — L659 Nonscarring hair loss, unspecified: Secondary | ICD-10-CM | POA: Diagnosis not present

## 2023-03-11 DIAGNOSIS — R5383 Other fatigue: Secondary | ICD-10-CM | POA: Diagnosis not present

## 2023-04-20 DIAGNOSIS — L731 Pseudofolliculitis barbae: Secondary | ICD-10-CM | POA: Diagnosis not present

## 2023-04-20 DIAGNOSIS — L659 Nonscarring hair loss, unspecified: Secondary | ICD-10-CM | POA: Diagnosis not present

## 2024-02-02 DIAGNOSIS — R635 Abnormal weight gain: Secondary | ICD-10-CM | POA: Diagnosis not present

## 2024-02-02 DIAGNOSIS — M2550 Pain in unspecified joint: Secondary | ICD-10-CM | POA: Diagnosis not present

## 2024-02-02 DIAGNOSIS — N951 Menopausal and female climacteric states: Secondary | ICD-10-CM | POA: Diagnosis not present

## 2024-02-02 DIAGNOSIS — N329 Bladder disorder, unspecified: Secondary | ICD-10-CM | POA: Diagnosis not present

## 2024-02-02 DIAGNOSIS — Z6828 Body mass index (BMI) 28.0-28.9, adult: Secondary | ICD-10-CM | POA: Diagnosis not present

## 2024-02-08 DIAGNOSIS — N951 Menopausal and female climacteric states: Secondary | ICD-10-CM | POA: Diagnosis not present

## 2024-02-08 DIAGNOSIS — H524 Presbyopia: Secondary | ICD-10-CM | POA: Diagnosis not present

## 2024-02-08 DIAGNOSIS — Z1339 Encounter for screening examination for other mental health and behavioral disorders: Secondary | ICD-10-CM | POA: Diagnosis not present

## 2024-02-08 DIAGNOSIS — Z961 Presence of intraocular lens: Secondary | ICD-10-CM | POA: Diagnosis not present

## 2024-02-08 DIAGNOSIS — R232 Flushing: Secondary | ICD-10-CM | POA: Diagnosis not present

## 2024-02-08 DIAGNOSIS — R635 Abnormal weight gain: Secondary | ICD-10-CM | POA: Diagnosis not present

## 2024-02-08 DIAGNOSIS — H04123 Dry eye syndrome of bilateral lacrimal glands: Secondary | ICD-10-CM | POA: Diagnosis not present

## 2024-02-08 DIAGNOSIS — Z1331 Encounter for screening for depression: Secondary | ICD-10-CM | POA: Diagnosis not present

## 2024-02-08 DIAGNOSIS — M2550 Pain in unspecified joint: Secondary | ICD-10-CM | POA: Diagnosis not present

## 2024-02-08 DIAGNOSIS — E785 Hyperlipidemia, unspecified: Secondary | ICD-10-CM | POA: Diagnosis not present

## 2024-02-08 DIAGNOSIS — R7303 Prediabetes: Secondary | ICD-10-CM | POA: Diagnosis not present

## 2024-02-08 DIAGNOSIS — Z6828 Body mass index (BMI) 28.0-28.9, adult: Secondary | ICD-10-CM | POA: Diagnosis not present

## 2024-02-16 DIAGNOSIS — Z6828 Body mass index (BMI) 28.0-28.9, adult: Secondary | ICD-10-CM | POA: Diagnosis not present

## 2024-02-16 DIAGNOSIS — M2550 Pain in unspecified joint: Secondary | ICD-10-CM | POA: Diagnosis not present

## 2024-02-22 DIAGNOSIS — Z6827 Body mass index (BMI) 27.0-27.9, adult: Secondary | ICD-10-CM | POA: Diagnosis not present

## 2024-02-22 DIAGNOSIS — E785 Hyperlipidemia, unspecified: Secondary | ICD-10-CM | POA: Diagnosis not present

## 2024-02-22 DIAGNOSIS — M2550 Pain in unspecified joint: Secondary | ICD-10-CM | POA: Diagnosis not present

## 2024-03-22 IMAGING — DX DG ANKLE COMPLETE 3+V*L*
3 series · 3 of 3 positions shown · non-contrast
Comparison: None.

CLINICAL DATA: Lateral ankle pain.  No known injury.

EXAM:
LEFT ANKLE COMPLETE - 3+ VIEW

[ankle ap]
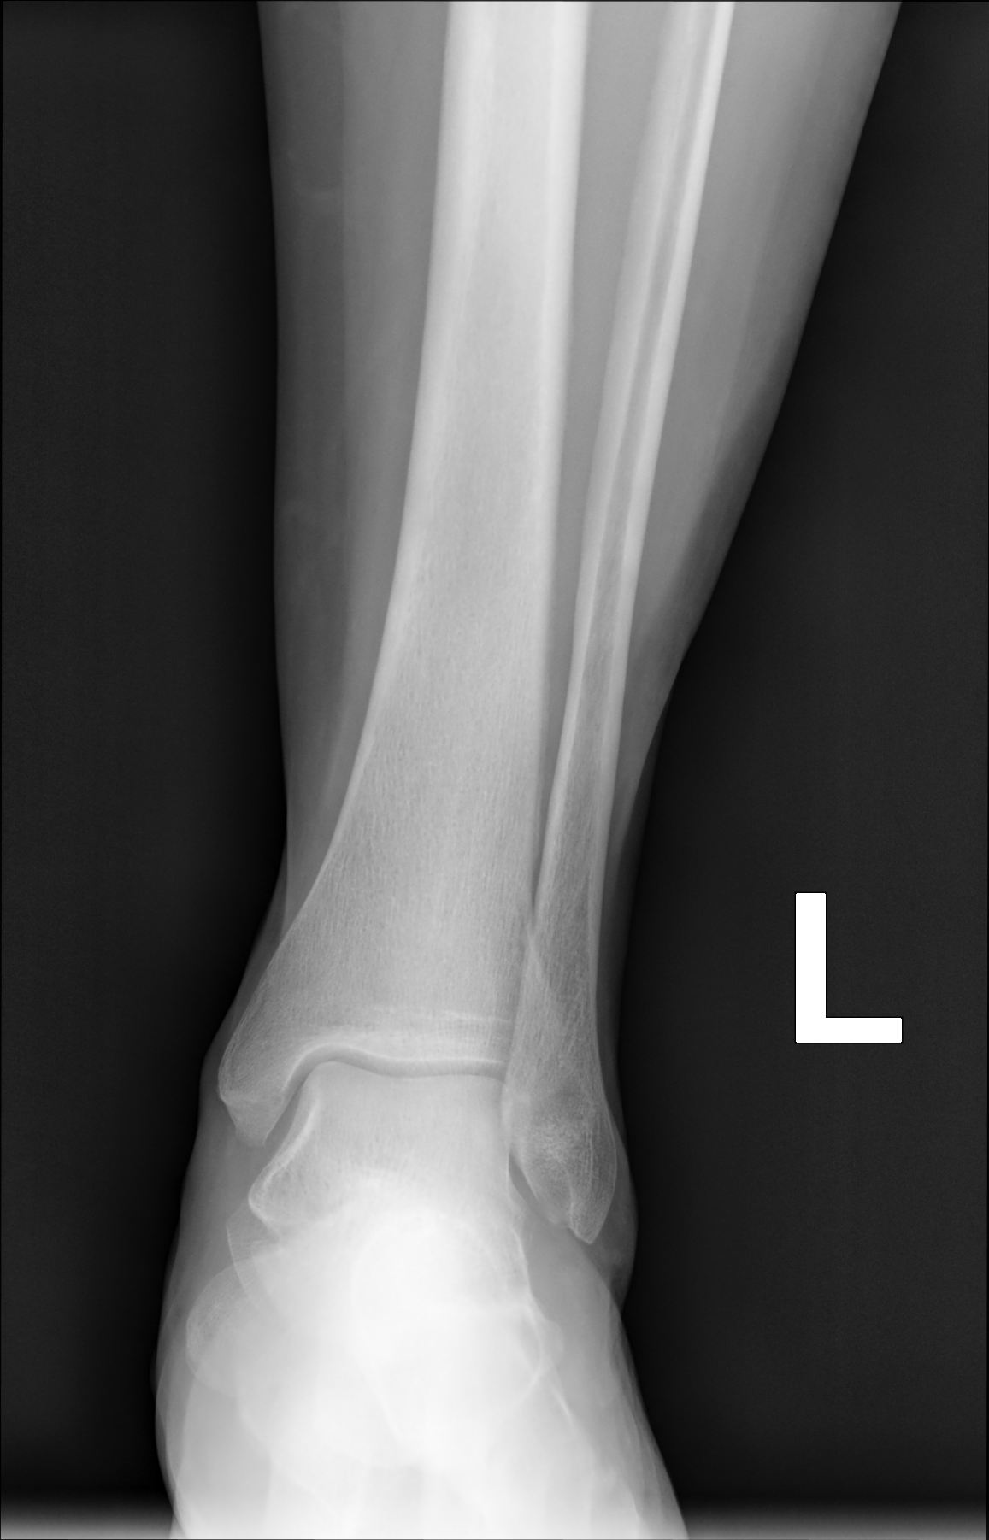

[ankle mlo]
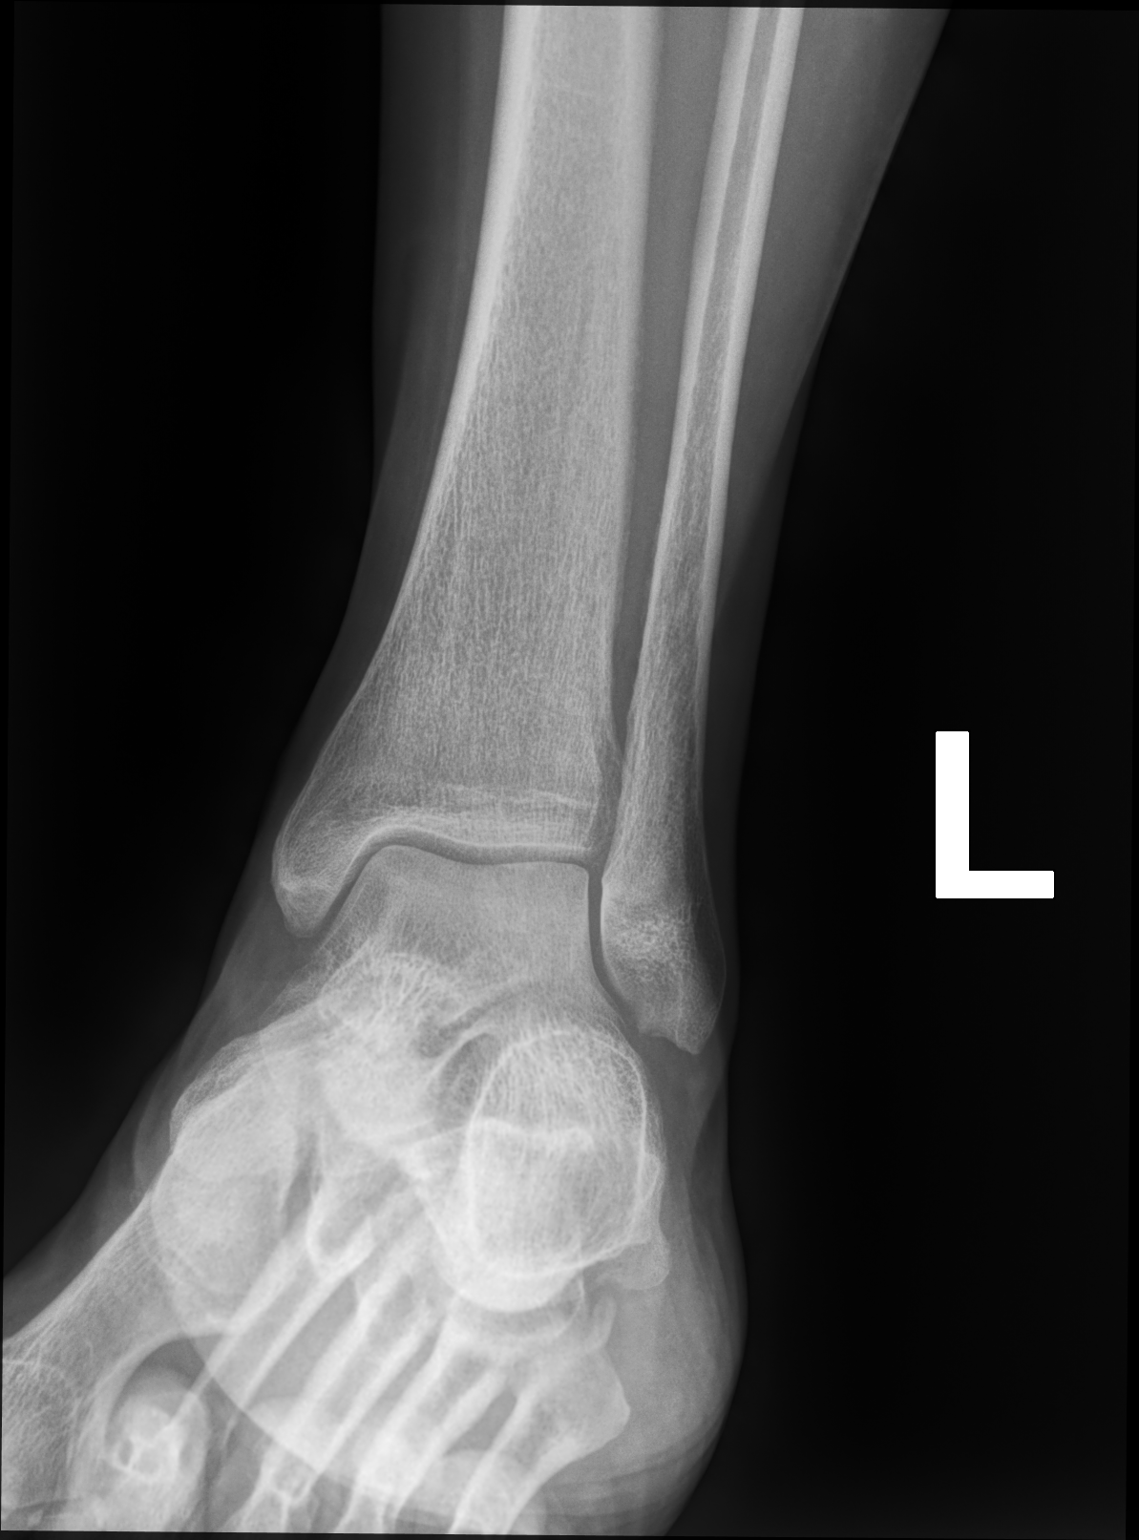

[ankle lat]
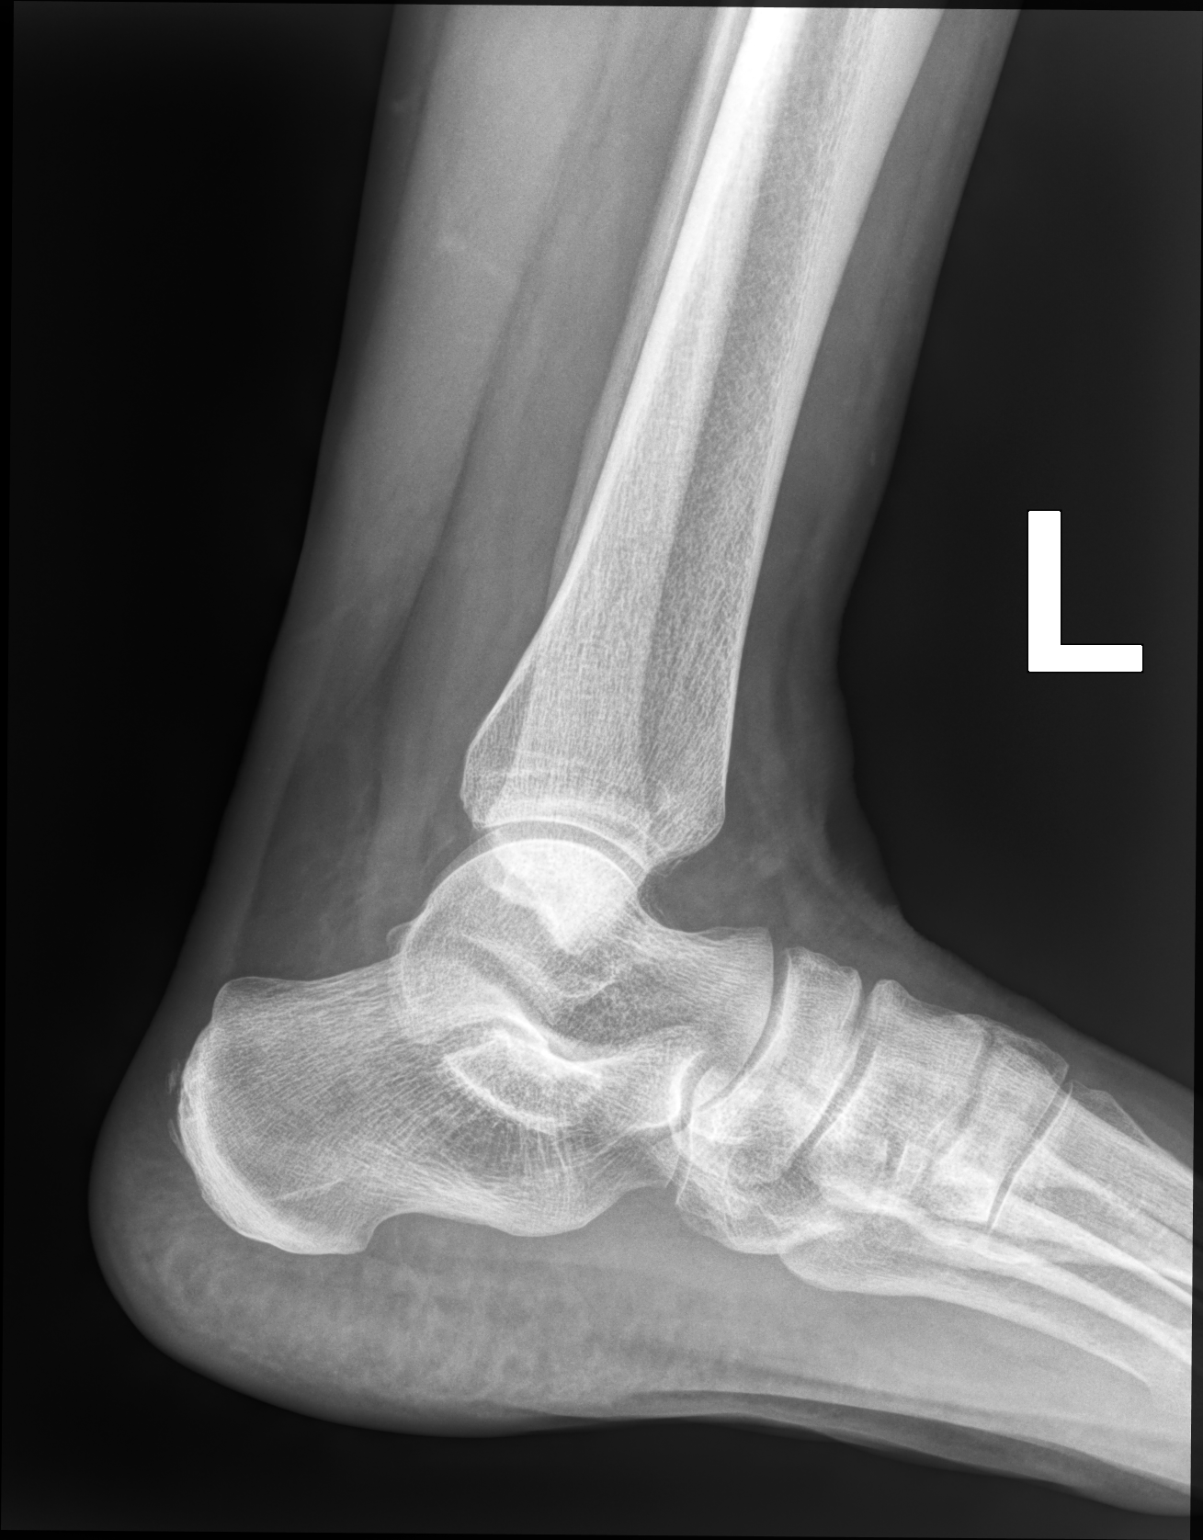

[3 of 3 positions shown; findings below may reference images not displayed]

FINDINGS: There is no evidence of fracture, dislocation, or joint effusion.
There is no evidence of arthropathy or other focal bone abnormality.
Soft tissues are unremarkable.
IMPRESSION: Negative.
